# Patient Record
Sex: Female | Born: 1985 | Race: White | Hispanic: No | Marital: Single | Smoking: Never smoker
Health system: Southern US, Community
[De-identification: ages and names within clinical notes are randomized; demographics above are authoritative.]

## PROBLEM LIST (undated history)

## (undated) DIAGNOSIS — Z9109 Other allergy status, other than to drugs and biological substances: Secondary | ICD-10-CM

## (undated) DIAGNOSIS — L709 Acne, unspecified: Secondary | ICD-10-CM

## (undated) DIAGNOSIS — R51 Headache: Secondary | ICD-10-CM

## (undated) DIAGNOSIS — R519 Headache, unspecified: Secondary | ICD-10-CM

## (undated) DIAGNOSIS — Z87440 Personal history of urinary (tract) infections: Secondary | ICD-10-CM

## (undated) HISTORY — DX: Headache, unspecified: R51.9

## (undated) HISTORY — DX: Headache: R51

## (undated) HISTORY — DX: Other allergy status, other than to drugs and biological substances: Z91.09

## (undated) HISTORY — DX: Acne, unspecified: L70.9

---
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
## 2007-01-01 ENCOUNTER — Ambulatory Visit: Payer: Self-pay | Admitting: Family Medicine

## 2007-03-25 ENCOUNTER — Ambulatory Visit: Payer: Self-pay | Admitting: Family Medicine

## 2007-07-11 ENCOUNTER — Ambulatory Visit: Payer: Self-pay | Admitting: Family Medicine

## 2007-08-13 IMAGING — NM NM VCUG
2 series · 7 of 7 positions shown · non-contrast
Comparison: None available.

CLINICAL DATA: Pyelonephritis. 
 NUCLEAR MEDICINE VCUG ? 02/04/2006:
TECHNIQUE: 1 mCi 3c-WWm Pertechnetate infused into the bladder via Foley catheter along with 287cc of normal saline.

[Series 1: dy vcug · 9.36mm/px · 6 of 53 frames shown (1 of 2)]
[frame 5/53]
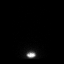
[frame 13/53]
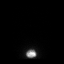
[frame 22/53]
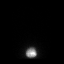
[frame 31/53]
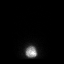
[frame 40/53]
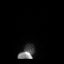
[frame 49/53]
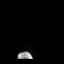

[Series 1: dy vcug · 1 of 1 slices shown (2 of 2)]
[im 1/1]
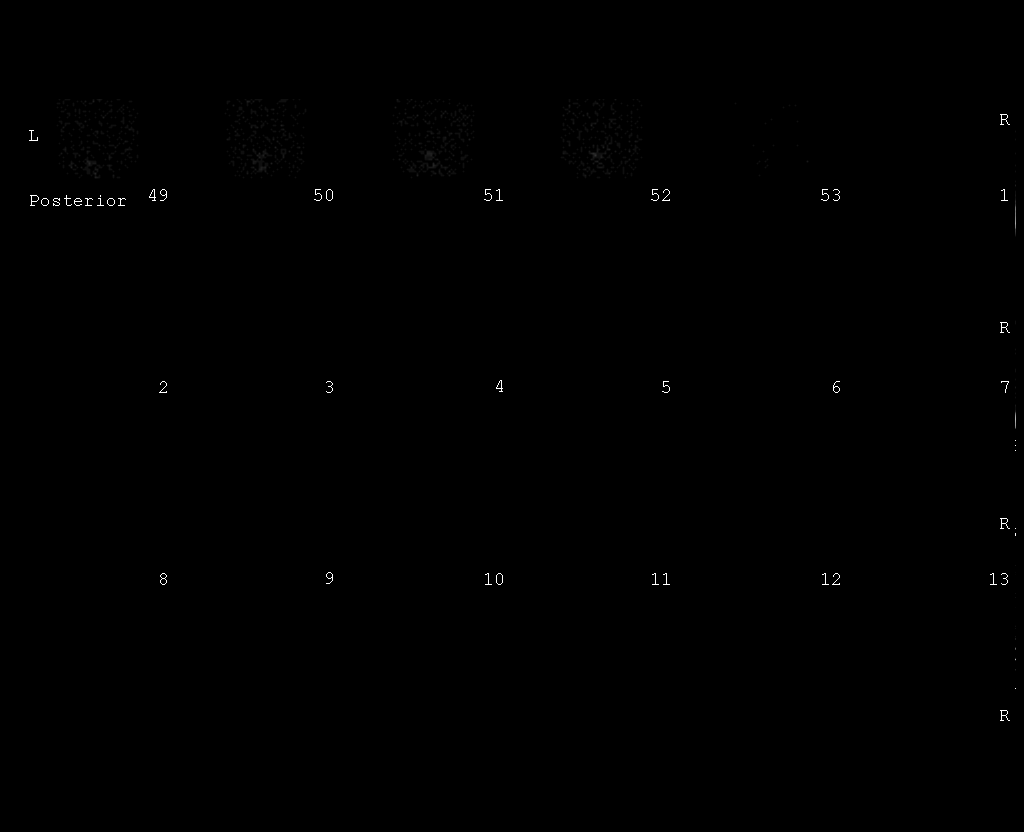

[7 of 7 positions shown; findings below may reference images not displayed]

FINDINGS: There is no evidence of vesicoureteral reflux.  Minimal post-void residual.
IMPRESSION: No evidence of vesicoureteral reflux.

## 2007-09-15 ENCOUNTER — Ambulatory Visit: Payer: Self-pay | Admitting: Family Medicine

## 2007-10-08 ENCOUNTER — Ambulatory Visit: Payer: Self-pay | Admitting: Family Medicine

## 2007-10-24 ENCOUNTER — Other Ambulatory Visit: Admission: RE | Admit: 2007-10-24 | Discharge: 2007-10-24 | Payer: Self-pay | Admitting: Gynecology

## 2008-04-07 ENCOUNTER — Ambulatory Visit: Payer: Self-pay | Admitting: Family Medicine

## 2008-05-27 ENCOUNTER — Other Ambulatory Visit: Admission: RE | Admit: 2008-05-27 | Discharge: 2008-05-27 | Payer: Self-pay | Admitting: Gynecology

## 2008-05-31 ENCOUNTER — Ambulatory Visit: Payer: Self-pay | Admitting: Family Medicine

## 2008-10-06 ENCOUNTER — Ambulatory Visit: Payer: Self-pay | Admitting: Gynecology

## 2008-10-29 ENCOUNTER — Other Ambulatory Visit: Admission: RE | Admit: 2008-10-29 | Discharge: 2008-10-29 | Payer: Self-pay | Admitting: Gynecology

## 2008-10-29 ENCOUNTER — Ambulatory Visit: Payer: Self-pay | Admitting: Women's Health

## 2009-04-06 ENCOUNTER — Ambulatory Visit: Payer: Self-pay | Admitting: Family Medicine

## 2009-05-30 ENCOUNTER — Ambulatory Visit: Payer: Self-pay | Admitting: Family Medicine

## 2009-06-16 ENCOUNTER — Ambulatory Visit: Payer: Self-pay | Admitting: Urology

## 2009-10-31 ENCOUNTER — Ambulatory Visit: Payer: Self-pay | Admitting: Women's Health

## 2009-10-31 ENCOUNTER — Other Ambulatory Visit: Admission: RE | Admit: 2009-10-31 | Discharge: 2009-10-31 | Payer: Self-pay | Admitting: Gynecology

## 2010-05-09 ENCOUNTER — Ambulatory Visit: Payer: Self-pay | Admitting: Family Medicine

## 2010-05-11 ENCOUNTER — Encounter: Admission: RE | Admit: 2010-05-11 | Discharge: 2010-05-11 | Payer: Self-pay | Admitting: Family Medicine

## 2010-11-01 ENCOUNTER — Ambulatory Visit: Payer: Self-pay | Admitting: Gynecology

## 2010-11-03 ENCOUNTER — Other Ambulatory Visit
Admission: RE | Admit: 2010-11-03 | Discharge: 2010-11-03 | Payer: Self-pay | Source: Home / Self Care | Admitting: Gynecology

## 2011-04-15 ENCOUNTER — Inpatient Hospital Stay (INDEPENDENT_AMBULATORY_CARE_PROVIDER_SITE_OTHER)
Admission: RE | Admit: 2011-04-15 | Discharge: 2011-04-15 | Disposition: A | Discharge status: Home / Self Care | Payer: BC Managed Care – PPO | Source: Ambulatory Visit | Attending: Emergency Medicine | Admitting: Emergency Medicine

## 2011-04-15 DIAGNOSIS — B86 Scabies: Secondary | ICD-10-CM

## 2011-06-08 ENCOUNTER — Encounter: Payer: Self-pay | Admitting: Family Medicine

## 2011-06-12 ENCOUNTER — Ambulatory Visit (INDEPENDENT_AMBULATORY_CARE_PROVIDER_SITE_OTHER): Payer: BC Managed Care – PPO | Admitting: Family Medicine

## 2011-06-12 ENCOUNTER — Encounter: Payer: Self-pay | Admitting: Family Medicine

## 2011-06-12 VITALS — BP 122/78 | HR 64 | Ht 66.0 in | Wt 139.0 lb

## 2011-06-12 DIAGNOSIS — J069 Acute upper respiratory infection, unspecified: Secondary | ICD-10-CM

## 2011-06-12 NOTE — Progress Notes (Signed)
  Subjective:    Patient ID: Margaret Wang, female    DOB: 11/10/1986, 25 y.o.   MRN: [id number]  HPI She is getting ready to start graduate school at [employer]. She has no particular concerns other than recent URI symptoms of nasal congestion, slight sore throat and swollen glands. No fever or chills. Review of her record indicates she did indeed have chickenpox at age 30. Review of Systems     Objective:   Physical Exam alert and in no distress. Tympanic membranes and canals are normal. Throat is clear. Tonsils are normal. Neck is supple without adenopathy or thyromegaly. Cardiac exam shows a regular sinus rhythm without murmurs or gallops. Lungs are clear to auscultation.        Assessment & Plan:  URI. Supportive care. Immunization was given to  her.

## 2011-10-29 DIAGNOSIS — L709 Acne, unspecified: Secondary | ICD-10-CM | POA: Insufficient documentation

## 2011-11-07 ENCOUNTER — Ambulatory Visit (INDEPENDENT_AMBULATORY_CARE_PROVIDER_SITE_OTHER): Payer: BC Managed Care – PPO | Admitting: Gynecology

## 2011-11-07 ENCOUNTER — Encounter: Payer: Self-pay | Admitting: Gynecology

## 2011-11-07 VITALS — BP 120/70 | Ht 65.5 in | Wt 142.0 lb

## 2011-11-07 DIAGNOSIS — Z309 Encounter for contraceptive management, unspecified: Secondary | ICD-10-CM

## 2011-11-07 DIAGNOSIS — Z13220 Encounter for screening for lipoid disorders: Secondary | ICD-10-CM

## 2011-11-07 DIAGNOSIS — Z01419 Encounter for gynecological examination (general) (routine) without abnormal findings: Secondary | ICD-10-CM

## 2011-11-07 DIAGNOSIS — Z113 Encounter for screening for infections with a predominantly sexual mode of transmission: Secondary | ICD-10-CM

## 2011-11-07 LAB — LIPID PANEL
HDL: 71 mg/dL (ref >39)
Total CHOL/HDL Ratio: 2.1 Ratio

## 2011-11-07 MED ORDER — ETONOGESTREL-ETHINYL ESTRADIOL 0.12-0.015 MG/24HR VA RING: VAGINAL_RING | VAGINAL | Status: DC

## 2011-11-07 NOTE — Progress Notes (Signed)
Margaret Wang 11/14/1985 [id number]        25 y.o.  for annual exam.  Wants to discuss birth control options.  Past medical history,surgical history, medications, allergies, family history and social history were all reviewed and documented in the EPIC chart. ROS:  Was performed and pertinent positives and negatives are included in the history.  Exam: chaperone present Filed Vitals:   11/07/11 0928  BP: 120/70   General appearance  Normal Skin grossly normal Head/Neck normal with no cervical or supraclavicular adenopathy thyroid normal Lungs  clear Cardiac RR, without RMG Abdominal  soft, nontender, without masses, organomegaly or hernia Breasts  examined lying and sitting without masses, retractions, discharge or axillary adenopathy. Pelvic  Ext/BUS/vagina  normal   Cervix  normal  GC Chlamydia screen done  Uterus  anteverted, normal size, shape and contour, midline and mobile nontender   Adnexa  Without masses or tenderness    Anus and perineum  normal    Assessment/Plan:  25 y.o. female for annual exam.    1. Birth control options. I reviewed all forms of contraception with her to include pill, patch, ring, Depo-Provera, Nexplanon, IUDs both Mirena and ParaGard. Patient had questions about Mirena IUD. I reviewed the issues of IUD to include possible infection leading to infertility. The current ACOG statement as far as being a legitimate choice for young women was also discussed. She is having some issues with acne. I did discuss possible increased acne risk with Mirena. After lengthy discussion she wants to try NuvaRing and I gave her a starter kit with a sample ring and annual prescription written. Every other month withdrawal options, offbrand labeling was reviewed. Patient will go ahead and try this follow up if there any issues. 2. STD screening. Patient asked for STD screening and a GC Chlamydia screen was done. No known exposure but wants to be screened. 3. Pap smear. She has  no history of significant abnormal Pap smears. She did have an ASCUS, negative high risk HPV in 2008 with annual follow ups all normal. I discussed less frequent screening interval. No Pap was done today and we'll plan on every 3 your screening with her last Pap smear being 2011. 4. Health maintenance. SBE monthly reviewed. Will check baseline CBC and lipid profile with urinalysis. Assuming she does well after starting the NuvaRing she'll see me in a year sooner as needed.    Dara Lords MD, 9:55 AM 11/07/2011

## 2011-11-07 NOTE — Patient Instructions (Signed)
Start NuvaRing as we discussed. If there are any issues call.

## 2011-11-08 LAB — GC/CHLAMYDIA PROBE AMP, GENITAL: GC Probe Amp, Genital: NEGATIVE

## 2011-11-17 IMAGING — US US SOFT TISSUE HEAD/NECK
1 series · 14 of 25 positions shown · non-contrast
Comparison: None.

CLINICAL DATA: Fatigue and weight gain.

THYROID ULTRASOUND
TECHNIQUE: Ultrasound examination of the thyroid gland and adjacent
soft tissues was performed.

[Series 1: us soft tissue head/neck · 0.07mm/px · 14 of 49 slices shown]
[im 1/49]
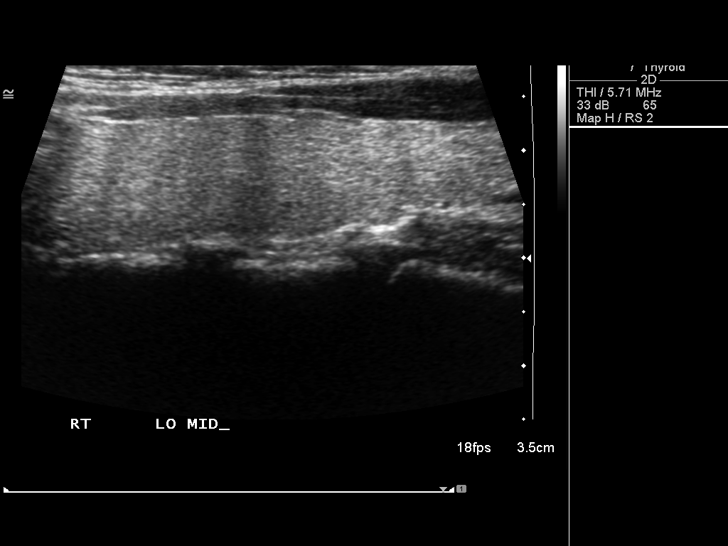
[im 5/49]
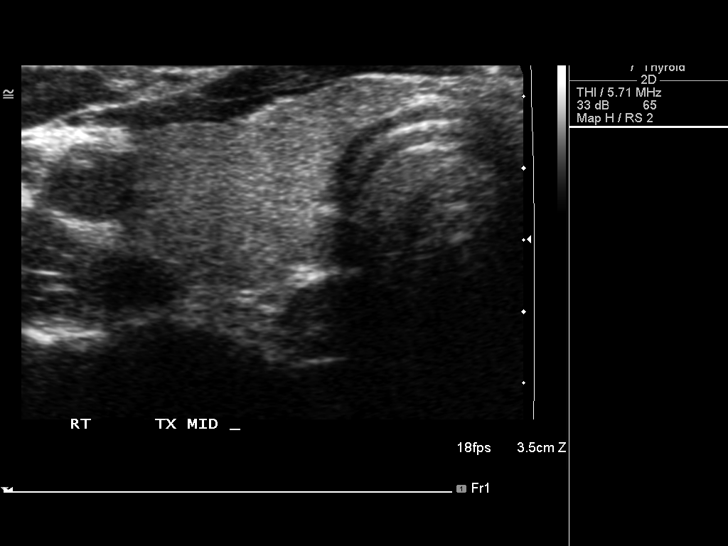
[im 9/49]
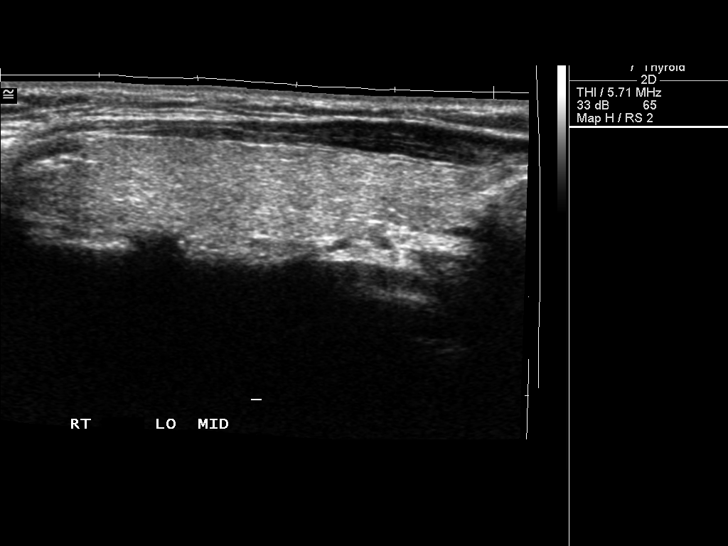
[im 13/49]
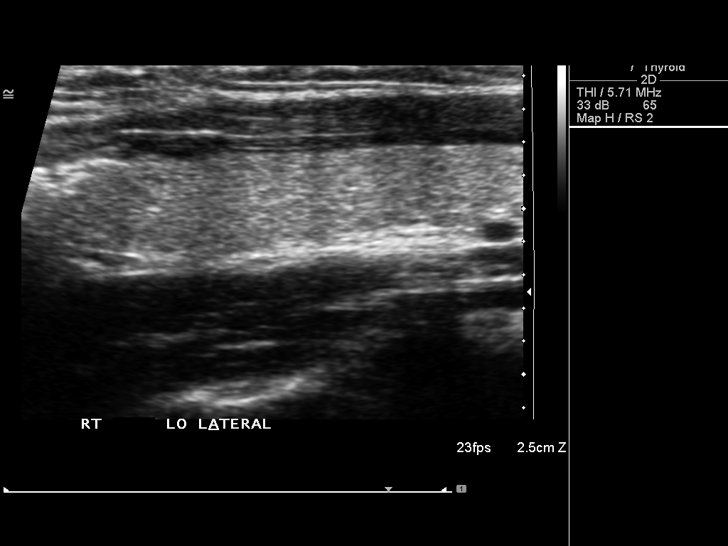
[im 17/49]
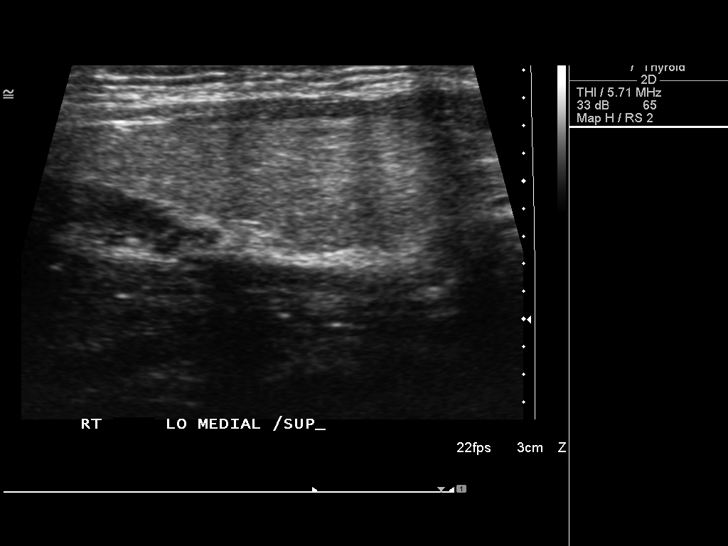
[im 19/49]
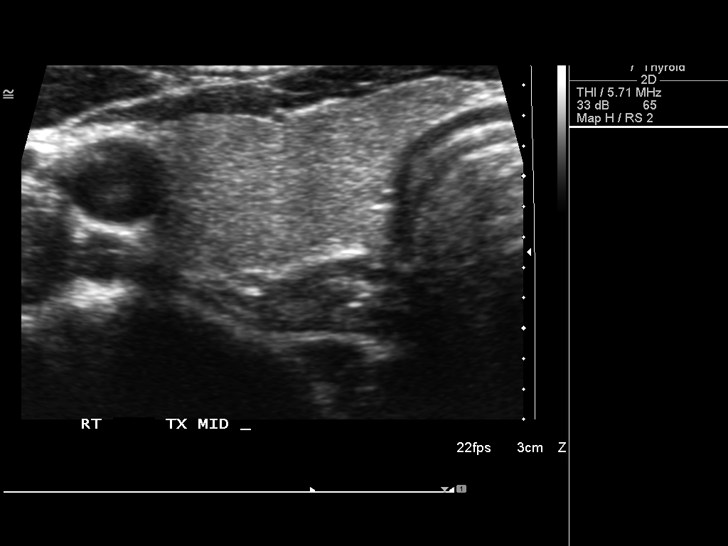
[im 23/49]
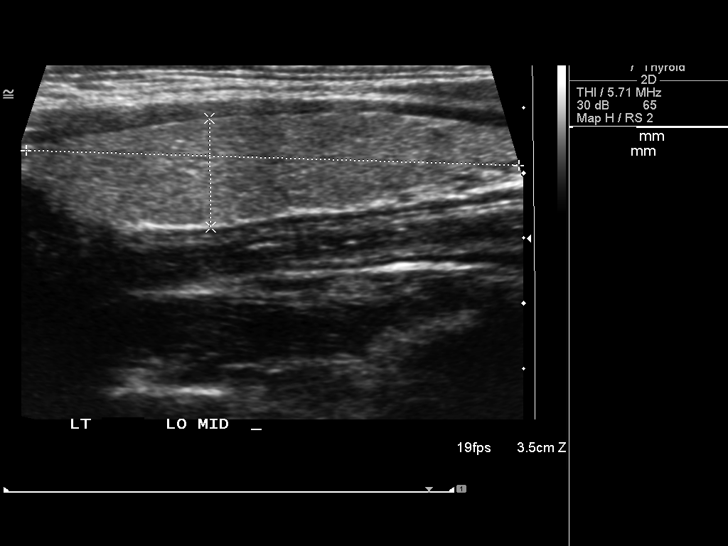
[im 27/49]
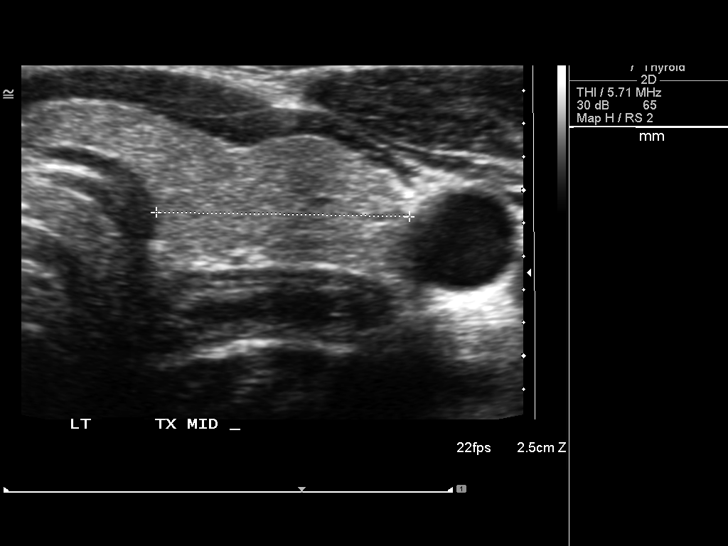
[im 31/49]
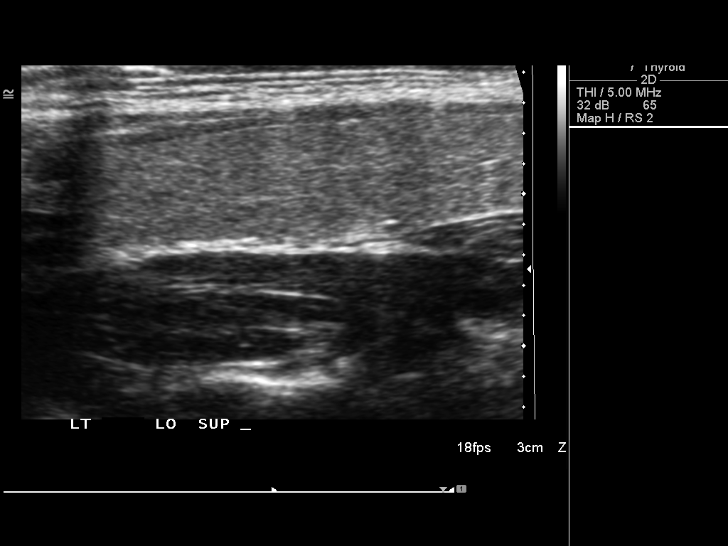
[im 33/49]
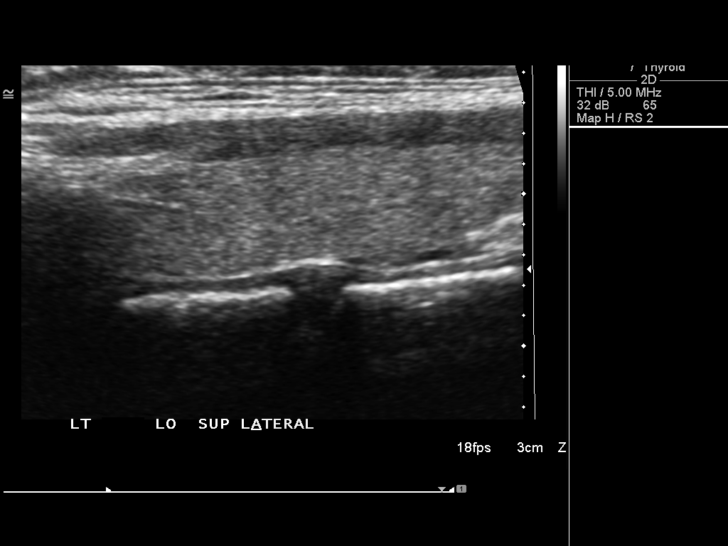
[im 37/49]
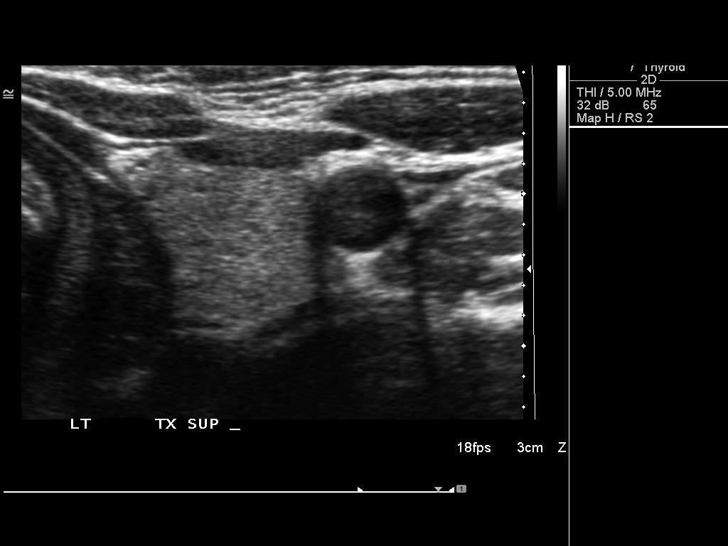
[im 41/49]
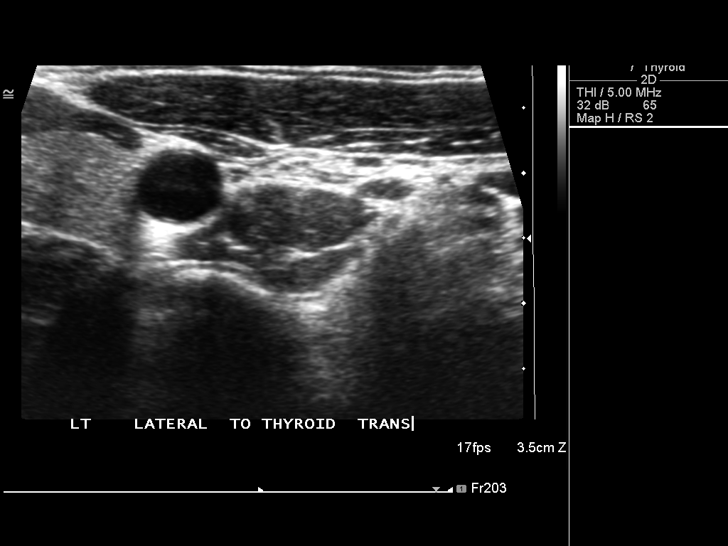
[im 45/49]
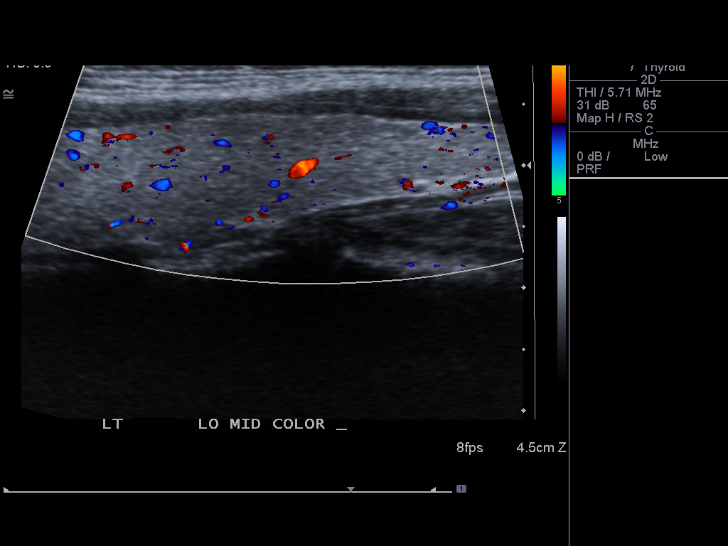
[im 49/49]
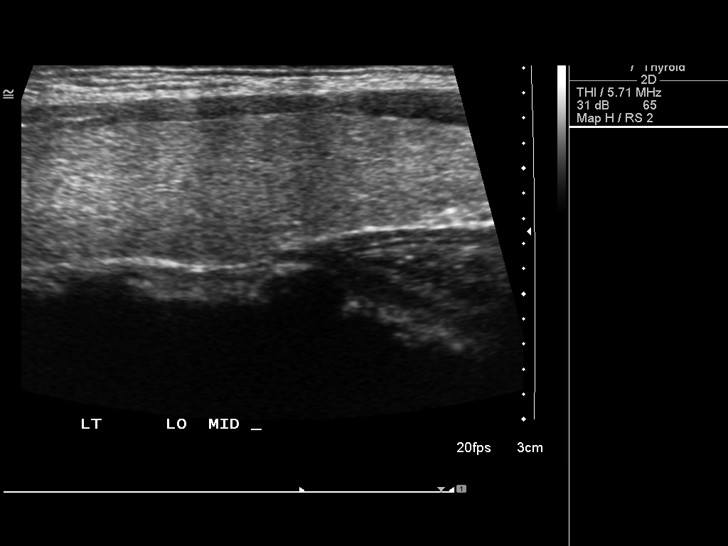

[14 of 25 positions shown; findings below may reference images not displayed]

FINDINGS: Right thyroid lobe:  4.8 x 1.0 x 1.4 cm
Left thyroid lobe:  4.1 x 0.8 x 1.5 cm
Isthmus:  0.3 cm

Focal nodules:  None

Lymphadenopathy:  Absent
IMPRESSION: Normal thyroid ultrasound.

## 2012-01-18 ENCOUNTER — Telehealth: Payer: Self-pay | Admitting: *Deleted

## 2012-01-18 MED ORDER — DROSPIRENONE-ETHINYL ESTRADIOL 3-0.02 MG PO TABS: 1.0000 | ORAL_TABLET | Freq: Every day | ORAL | Status: DC

## 2012-01-18 NOTE — Telephone Encounter (Signed)
Okay. Remind her as we have already discussed that there is a slight increased risk of thrombosis such as stroke heart attack DVT with yaz.

## 2012-01-18 NOTE — Telephone Encounter (Signed)
Left message on pt vm ,told pt to call if questions.

## 2012-01-18 NOTE — Telephone Encounter (Signed)
Pt has been using nuvaring since December 2012. She would like to switch back to her old birth control pills generic yaz. Pt said that she no longer feels comfortable with using nuvaring. Please advise

## 2012-06-03 ENCOUNTER — Telehealth: Payer: Self-pay | Admitting: *Deleted

## 2012-06-03 NOTE — Telephone Encounter (Signed)
Pt informed with the below note, she would like to have the ParaGard IUD, pt thinks this would be best for her body. Benefits will be checked and pt will be notified.

## 2012-06-03 NOTE — Telephone Encounter (Signed)
Okay to check benefits. ParaGard IUD good for 10 years but periods tend to be the same or heavier. Mirena is good for 5 years but periods tend to be very light or absent. My preference is Mirena but it would be patient's choice either way, just as long as she is aware of the difference.

## 2012-06-03 NOTE — Telephone Encounter (Signed)
Pt would like to switch to ParaGard IUD, she was taking generic yaz , but has decided that she would like IUD now. Pt said her LMP:05/21/12,  She stopped taking her birth control pill about 2 weeks ago, pt is using condoms for back up. OV or okay to check benefits? Please advise

## 2012-06-04 ENCOUNTER — Other Ambulatory Visit: Payer: Self-pay | Admitting: Gynecology

## 2012-06-04 ENCOUNTER — Telehealth: Payer: Self-pay | Admitting: Gynecology

## 2012-06-04 DIAGNOSIS — Z3049 Encounter for surveillance of other contraceptives: Secondary | ICD-10-CM

## 2012-06-04 NOTE — Telephone Encounter (Signed)
Patient informed I spoke with [place] at [facility] and was informed that Paraguard covered with a $10 copayment then ins. Pays 100%. Patient wants to proceed. Appt scheduled and order put in system.

## 2012-06-12 HISTORY — PX: INTRAUTERINE DEVICE INSERTION: SHX323

## 2012-06-20 ENCOUNTER — Ambulatory Visit (INDEPENDENT_AMBULATORY_CARE_PROVIDER_SITE_OTHER): Payer: BC Managed Care – PPO | Admitting: Gynecology

## 2012-06-20 ENCOUNTER — Encounter: Payer: Self-pay | Admitting: Gynecology

## 2012-06-20 DIAGNOSIS — Z30430 Encounter for insertion of intrauterine contraceptive device: Secondary | ICD-10-CM

## 2012-06-20 DIAGNOSIS — N882 Stricture and stenosis of cervix uteri: Secondary | ICD-10-CM

## 2012-06-20 NOTE — Progress Notes (Signed)
Patient presents for ParaGard IUD placement. She has been previously counseled on contraceptive options and has decided for IUD. She chose ParaGard over Mirena because of her side effects with oral contraceptives and she wanted something that was nonhormonal. I reviewed with her that she may have heavier periods or more crampy periods with the ParaGard and she understands and accepts this. She currently is on a normal menses and does not have a copper allergy. She understands that the ParaGard is good for 10 years.  She has read through the booklet, has no contraindications.  I reviewed the insertional process with her as well as the risks to include infection either immediate or long-term, uterine perforation or migration requiring surgery to remove and possibilities of failure with subsequent pregnancy.  I also again reviewed with her the issues of IUD and nulliparous patient and the potential for infection leading to infertility.  Exam with Sherrilyn Rist assistant Pelvic: External BUS vagina normal. Cervix normal with normal menses flow. Uterus retroverted to axial normal size shape contour midline mobile nontender. Adnexa without masses or tenderness.  Procedure: The cervix was cleansed with Betadine and due to her nulliparous status a paracervical block using 1% lidocaine 8 cc total was placed, anterior lip grasped with a single-tooth tenaculum, the uterus was sounded and a ParaGard IUD was placed according to manufacturer's recommendations without difficulty. The strings were trimmed. The patient tolerated well and will follow up in one month for a postinsertional check.  Lot number:[id number]

## 2012-06-20 NOTE — Patient Instructions (Signed)
Intrauterine Device Insertion Most often, an intrauterine device (IUD) is inserted into the uterus to prevent pregnancy. There are 2 types of IUDs available:  Copper IUD. This type of IUD creates an environment that is not favorable to sperm survival. The mechanism of action of the copper IUD is not known for certain. It can stay in place for 10 years.   Hormone IUD. This type of IUD contains the hormone progestin (synthetic progesterone). The progestin thickens the cervical mucus and prevents sperm from entering the uterus, and it also thins the uterine lining. There is no evidence that the hormone IUD prevents implantation. The hormone IUD can stay in place for up to 5 years.  An IUD is the most cost-effective birth control if left in place for the full duration. It may be removed at any time. LET YOUR CAREGIVER KNOW ABOUT:  Sensitivity to metals.   Medicines taken including herbs, eyedrops, over-the-counter medicines, and creams.   Use of steroids (by mouth or creams).   Previous problems with anesthetics or numbing medicine.   Previous gynecological surgery.   History of blood clots or clotting disorders.   Possibility of pregnancy.   Menstrual irregularities.   Concerns regarding unusual vaginal discharge or odors.   Previous experience with an IUD.   Other health problems.  RISKS AND COMPLICATIONS  Accidental puncture (perforation) of the uterus.   Accidental placement of the IUD either in the muscle layer of the uterus (myometrium) or outside the uterus. If this happen, the IUD can be found essentially floating around the bowels. When this happens, the IUD must be taken out surgically.   The IUD may fall out of the uterus (expulsion). This is more common in women who have recently had a child.    Pregnancy in the fallopian tube (ectopic).  BEFORE THE PROCEDURE  Schedule the IUD insertion for when you will have your menstrual period or right after, to make sure you  are not pregnant. Placement of the IUD is better tolerated shortly after a menstrual cycle.   You may need to take tests or be examined to make sure you are not pregnant.   You may be required to take a pregnancy test.   You may be required to get checked for sexually transmitted infections (STIs) prior to placement. Placing an IUD in someone who has an infection can make an infection worse.   You may be given a pain reliever to take 1 or 2 hours before the procedure.   An exam will be performed to determine the size and position of your uterus.   Ask your caregiver about changing or stopping your regular medicines.  PROCEDURE   A tool (speculum) is placed in the vagina. This allows your caregiver to see the lower part of the uterus (cervix).   The cervix is prepped with a medicine that lowers the risk of infection.   You may be given a medicine to numb each side of the cervix (intracervical or paracervical block). This is used to block and control any discomfort with insertion.   A tool (uterine sound) is inserted into the uterus to determine the length of the uterine cavity and the direction the uterus may be tilted.   A slim instrument (IUD inserter) is inserted through the cervical canal and into your uterus.   The IUD is placed in the uterine cavity and the insertion device is removed.   The nylon string that is attached to the IUD, and used for   eventual IUD removal, is trimmed. It is trimmed so that it lays high in the vagina, just outside the cervix.  AFTER THE PROCEDURE  You may have bleeding after the procedure. This is normal. It varies from light spotting for a few days to menstrual-like bleeding.   You may have mild cramping.   Practice checking the string coming out of the cervix to make sure the IUD remains in the uterus. If you cannot feel the string, you should schedule a "string check" with your caregiver.   If you had a hormone IUD inserted, expect that your  period may be lighter or nonexistent within a year's time (though this is not always the case). There may be delayed fertility with the hormone IUD as a result of its progesterone effect. When you are ready to become pregnant, it is suggested to have the IUD removed up to 1 year in advance.   Yearly exams are advised.  Document Released: 06/27/2011 Document Revised: 10/18/2011 Document Reviewed: 06/27/2011 Exit[employer] Patient Information 2012 [employer]. 

## 2012-07-15 ENCOUNTER — Ambulatory Visit (INDEPENDENT_AMBULATORY_CARE_PROVIDER_SITE_OTHER): Payer: BC Managed Care – PPO | Admitting: Gynecology

## 2012-07-15 ENCOUNTER — Encounter: Payer: Self-pay | Admitting: Gynecology

## 2012-07-15 DIAGNOSIS — Z30431 Encounter for routine checking of intrauterine contraceptive device: Secondary | ICD-10-CM

## 2012-07-15 NOTE — Patient Instructions (Signed)
Intrauterine Device Insertion Most often, an intrauterine device (IUD) is inserted into the uterus to prevent pregnancy. There are 2 types of IUDs available:  Copper IUD. This type of IUD creates an environment that is not favorable to sperm survival. The mechanism of action of the copper IUD is not known for certain. It can stay in place for 10 years.   Hormone IUD. This type of IUD contains the hormone progestin (synthetic progesterone). The progestin thickens the cervical mucus and prevents sperm from entering the uterus, and it also thins the uterine lining. There is no evidence that the hormone IUD prevents implantation. The hormone IUD can stay in place for up to 5 years.  An IUD is the most cost-effective birth control if left in place for the full duration. It may be removed at any time. LET YOUR CAREGIVER KNOW ABOUT:  Sensitivity to metals.   Medicines taken including herbs, eyedrops, over-the-counter medicines, and creams.   Use of steroids (by mouth or creams).   Previous problems with anesthetics or numbing medicine.   Previous gynecological surgery.   History of blood clots or clotting disorders.   Possibility of pregnancy.   Menstrual irregularities.   Concerns regarding unusual vaginal discharge or odors.   Previous experience with an IUD.   Other health problems.  RISKS AND COMPLICATIONS  Accidental puncture (perforation) of the uterus.   Accidental placement of the IUD either in the muscle layer of the uterus (myometrium) or outside the uterus. If this happen, the IUD can be found essentially floating around the bowels. When this happens, the IUD must be taken out surgically.   The IUD may fall out of the uterus (expulsion). This is more common in women who have recently had a child.    Pregnancy in the fallopian tube (ectopic).  BEFORE THE PROCEDURE  Schedule the IUD insertion for when you will have your menstrual period or right after, to make sure you  are not pregnant. Placement of the IUD is better tolerated shortly after a menstrual cycle.   You may need to take tests or be examined to make sure you are not pregnant.   You may be required to take a pregnancy test.   You may be required to get checked for sexually transmitted infections (STIs) prior to placement. Placing an IUD in someone who has an infection can make an infection worse.   You may be given a pain reliever to take 1 or 2 hours before the procedure.   An exam will be performed to determine the size and position of your uterus.   Ask your caregiver about changing or stopping your regular medicines.  PROCEDURE   A tool (speculum) is placed in the vagina. This allows your caregiver to see the lower part of the uterus (cervix).   The cervix is prepped with a medicine that lowers the risk of infection.   You may be given a medicine to numb each side of the cervix (intracervical or paracervical block). This is used to block and control any discomfort with insertion.   A tool (uterine sound) is inserted into the uterus to determine the length of the uterine cavity and the direction the uterus may be tilted.   A slim instrument (IUD inserter) is inserted through the cervical canal and into your uterus.   The IUD is placed in the uterine cavity and the insertion device is removed.   The nylon string that is attached to the IUD, and used for   eventual IUD removal, is trimmed. It is trimmed so that it lays high in the vagina, just outside the cervix.  AFTER THE PROCEDURE  You may have bleeding after the procedure. This is normal. It varies from light spotting for a few days to menstrual-like bleeding.   You may have mild cramping.   Practice checking the string coming out of the cervix to make sure the IUD remains in the uterus. If you cannot feel the string, you should schedule a "string check" with your caregiver.   If you had a hormone IUD inserted, expect that your  period may be lighter or nonexistent within a year's time (though this is not always the case). There may be delayed fertility with the hormone IUD as a result of its progesterone effect. When you are ready to become pregnant, it is suggested to have the IUD removed up to 1 year in advance.   Yearly exams are advised.  Document Released: 06/27/2011 Document Revised: 10/18/2011 Document Reviewed: 06/27/2011 Exit[employer] Patient Information 2012 [employer]. 

## 2012-07-15 NOTE — Progress Notes (Signed)
Patient presents in follow up from [facility] IUD placement. She's doing well without complaints.  Exam was Sherrilyn Rist Asst. Pelvic external BUS vagina normal. Cervix normal IUD string visualized and trimmed. Uterus normal size midline mobile nontender. Adnexa without masses or tenderness.  Assessment and plan: IUD follow up. Doing well. Due for annual end of December/beginning of January she knows to schedule this.

## 2012-08-21 ENCOUNTER — Emergency Department
Admission: EM | Admit: 2012-08-21 | Discharge: 2012-08-21 | Disposition: A | Discharge status: Home / Self Care | Payer: BC Managed Care – PPO | Source: Home / Self Care

## 2012-08-21 ENCOUNTER — Encounter: Payer: Self-pay | Admitting: *Deleted

## 2012-08-21 DIAGNOSIS — Z87440 Personal history of urinary (tract) infections: Secondary | ICD-10-CM

## 2012-08-21 DIAGNOSIS — J039 Acute tonsillitis, unspecified: Secondary | ICD-10-CM

## 2012-08-21 DIAGNOSIS — R300 Dysuria: Secondary | ICD-10-CM

## 2012-08-21 DIAGNOSIS — J329 Chronic sinusitis, unspecified: Secondary | ICD-10-CM

## 2012-08-21 DIAGNOSIS — J029 Acute pharyngitis, unspecified: Secondary | ICD-10-CM

## 2012-08-21 HISTORY — DX: Personal history of urinary (tract) infections: Z87.440

## 2012-08-21 MED ORDER — CEFTRIAXONE SODIUM 1 G IJ SOLR
1.0000 g | Freq: Once | INTRAMUSCULAR | Status: AC
  Administered 2012-08-21: 1 g via INTRAMUSCULAR

## 2012-08-21 MED ORDER — CEFIXIME 200 MG/5ML PO SUSR: 400.0000 mg | Freq: Every day | ORAL | Status: AC

## 2012-08-21 MED ORDER — FLUTICASONE PROPIONATE 50 MCG/ACT NA SUSP: 2.0000 | Freq: Every day | NASAL | Status: DC

## 2012-08-21 NOTE — ED Provider Notes (Signed)
History     CSN: [id number]  Arrival date & time 08/21/12  1132   None     Chief Complaint  Patient presents with  . Sore Throat   HPI URI Symptoms Onset: 2 weeks Description: sinus pressure, drainage, sore throat, cough Modifying factors:  Pt states that she has a history of recurrent tonsillitis and sinusitis in the past. Last sinusitis was last year. Has had progressive sinus pressure and sore throat over the last 2 weeks.  Pt also recently seen for [facility] Pt was place on bactrim. Pt also reports hx/o recurrent UTIs in the past as well as in childhood. Has been seen by [facility] in the past. Last UTI was > 1 year ago. Pt states that she hasnt been able to swallow pills 2/2 throat pain. Dysuria has minimally improved. Urine culture was not obtained at [facility].   Symptoms Nasal discharge: mild  Fever: yes Sore throat: yes Cough: yes Wheezing: no Ear pain: no GI symptoms: no Sick contacts: unknown   Red Flags  Stiff neck: no Dyspnea: no Rash: no Swallowing difficulty: no  Sinusitis Risk Factors Headache/face pain: yes Double sickening: possible tooth pain: no  Allergy Risk Factors Sneezing: yes Itchy scratchy throat: no Seasonal symptoms: yes; sees allergist   Flu Risk Factors Headache: yes muscle aches: no severe fatigue: mild    Past Medical History  Diagnosis Date  . Acne   . Generalized headaches   . Vesicoureteral reflux   . History of recurrent UTIs     Past Surgical History  Procedure Date  . Intrauterine device insertion 06/2012    PARAGUARD    Family History  Problem Relation Age of Onset  . Asthma Brother   . Breast cancer Paternal Grandmother   . Hypertension Father   . Ovarian cancer Maternal Grandmother     History  Substance Use Topics  . Smoking status: Never Smoker   . Smokeless tobacco: Never Used  . Alcohol Use: Yes     ONE OR TWO DRINKS A WEEK    OB History    Grav Para Term  Preterm Abortions TAB SAB Ect Mult Living   0               Review of Systems  All other systems reviewed and are negative.    Allergies  Review of patient's allergies indicates no known allergies.  Home Medications   Current Outpatient Rx  Name Route Sig Dispense Refill  . SULFAMETHOXAZOLE-TMP DS 800-160 MG PO TABS Oral Take 1 tablet by mouth 2 (two) times daily.    . CEFIXIME 200 MG/5ML PO SUSR Oral Take 10 mLs (400 mg total) by mouth daily. 100 mL 0  . MULTIVITAMINS PO CAPS Oral Take 1 capsule by mouth daily.        BP 123/81  Pulse 113  Temp 98.4 F (36.9 C) (Oral)  Ht 5' 5.5" (1.664 m)  Wt 139 lb (63.05 kg)  BMI 22.78 kg/m2  SpO2 100%  LMP 08/07/2012  Physical Exam  Constitutional: She is oriented to person, place, and time. She appears well-developed and well-nourished.  HENT:  Head: Normocephalic and atraumatic.  Right Ear: External ear normal.  Left Ear: External ear normal.  Mouth/Throat: Oropharyngeal exudate present.       +nasal erythema, rhinorrhea bilaterally, + post oropharyngeal erythema    Eyes: Conjunctivae normal are normal. Pupils are equal, round, and reactive to light.  Neck: Normal range  of motion. Neck supple.  Cardiovascular: Normal rate and regular rhythm.   Pulmonary/Chest: Effort normal and breath sounds normal.  Abdominal: Soft. Bowel sounds are normal.       Mild suprapubic tenderness Faint flank pain    Musculoskeletal: Normal range of motion.  Lymphadenopathy:    She has cervical adenopathy.  Neurological: She is alert and oriented to person, place, and time.  Skin: Skin is warm.    ED Course  Procedures (including critical care time)   Labs Reviewed  POCT RAPID STREP A (OFFICE)   No results found.   1. Sinusitis   2. Tonsillitis   3. Pharyngitis   4. Dysuria   5. History of recurrent UTI (urinary tract infection)       MDM  Will treat with with broad spectrum abx for multi system coverage.  Rocephin 1gm IM  x1.  Suprax 400mg  daily x 7 days. Rxd in liquid form to help with swallowing.  Antibiotic selection discussed with pharmacist.  Given hx/o recurrent upper resp and GU infections, plan for follow up with urology. Discussed ENT set up.  Will also start on flonase and antihistamine to help with allergic sxs.  Discussed upper resp/pulmonary and GU red flags at length including trismus, SOB, fevers, worsening flank pain, generalized malaise.  Otherwise follow up as needed.      The patient and/or caregiver has been counseled thoroughly with regard to treatment plan and/or medications prescribed including dosage, schedule, interactions, rationale for use, and possible side effects and they verbalize understanding. Diagnoses and expected course of recovery discussed and will return if not improved as expected or if the condition worsens. Patient and/or caregiver verbalized understanding.             Doree Albee, MD 08/21/12 1310

## 2012-08-21 NOTE — ED Notes (Signed)
Patient c/o sore throat x last night. Tonsils are enlarged with white spots. She is currently prescribed bactrim for UTI diagnosed yesterday. She has only had 2 doses, too painful to swallow pills.

## 2012-08-23 ENCOUNTER — Telehealth: Payer: Self-pay

## 2012-08-23 NOTE — ED Notes (Signed)
Left a message on voice mail asking how patient is feeling and advising to call back with any questions or concerns.  

## 2012-12-08 ENCOUNTER — Encounter: Payer: BC Managed Care – PPO | Admitting: Gynecology

## 2012-12-19 ENCOUNTER — Encounter: Payer: BC Managed Care – PPO | Admitting: Gynecology

## 2012-12-26 ENCOUNTER — Encounter: Payer: BC Managed Care – PPO | Admitting: Gynecology

## 2013-01-26 ENCOUNTER — Other Ambulatory Visit (HOSPITAL_COMMUNITY)
Admission: RE | Admit: 2013-01-26 | Discharge: 2013-01-26 | Disposition: A | Discharge status: Home / Self Care | Payer: BC Managed Care – PPO | Source: Ambulatory Visit | Attending: Gynecology | Admitting: Gynecology

## 2013-01-26 ENCOUNTER — Encounter: Payer: Self-pay | Admitting: Gynecology

## 2013-01-26 ENCOUNTER — Ambulatory Visit: Payer: BC Managed Care – PPO | Admitting: Gynecology

## 2013-01-26 VITALS — BP 116/74 | Ht 66.0 in | Wt 138.0 lb

## 2013-01-26 DIAGNOSIS — R209 Unspecified disturbances of skin sensation: Secondary | ICD-10-CM

## 2013-01-26 DIAGNOSIS — N644 Mastodynia: Secondary | ICD-10-CM

## 2013-01-26 DIAGNOSIS — Z9109 Other allergy status, other than to drugs and biological substances: Secondary | ICD-10-CM | POA: Insufficient documentation

## 2013-01-26 DIAGNOSIS — Z113 Encounter for screening for infections with a predominantly sexual mode of transmission: Secondary | ICD-10-CM

## 2013-01-26 DIAGNOSIS — Z01419 Encounter for gynecological examination (general) (routine) without abnormal findings: Secondary | ICD-10-CM

## 2013-01-26 DIAGNOSIS — Z30431 Encounter for routine checking of intrauterine contraceptive device: Secondary | ICD-10-CM

## 2013-01-26 LAB — CBC WITH DIFFERENTIAL/PLATELET
Basophils Absolute: 0 10*3/uL (ref 0.0–0.1)
Eosinophils Absolute: 0.2 10*3/uL (ref 0.0–0.7)
HCT: 41.5 % (ref 36.0–46.0)
MCH: 29.9 pg (ref 26.0–34.0)
MCV: 88.7 fL (ref 78.0–100.0)
Monocytes Relative: 8 % (ref 3–12)
Neutro Abs: 4.4 10*3/uL (ref 1.7–7.7)
Neutrophils Relative %: 53 % (ref 43–77)
RDW: 13.1 % (ref 11.5–15.5)

## 2013-01-26 LAB — SEDIMENTATION RATE: Sed Rate: 1 mm/hr (ref 0–22)

## 2013-01-26 LAB — COMPREHENSIVE METABOLIC PANEL
ALT: 14 U/L (ref 0–35)
AST: 24 U/L (ref 0–37)
Albumin: 4.9 g/dL (ref 3.5–5.2)
BUN: 13 mg/dL (ref 6–23)
Calcium: 9.4 mg/dL (ref 8.4–10.5)
Creat: 0.77 mg/dL (ref 0.50–1.10)
Glucose, Bld: 79 mg/dL (ref 70–99)
Potassium: 3.8 mEq/L (ref 3.5–5.3)
Total Protein: 6.9 g/dL (ref 6.0–8.3)

## 2013-01-26 LAB — TSH: TSH: 2.03 u[IU]/mL (ref 0.350–4.500)

## 2013-01-26 NOTE — Patient Instructions (Signed)
Log on to my chart to check your lab results. Followup in one year for annual exam. Followup sooner if breakthrough bleeding continues or any other issues.

## 2013-01-26 NOTE — Progress Notes (Signed)
Margaret Wang Aug 26, 1986 [id number]        27 y.o.  G0P0 for annual exam.  Several issues noted below.  Past medical history,surgical history, medications, allergies, family history and social history were all reviewed and documented in the EPIC chart. ROS:  Was performed and pertinent positives and negatives are included in the history.  Exam: Kim assistant Filed Vitals:   01/26/13 0956  BP: 116/74  Height: 5\' 6"  (1.676 m)  Weight: 138 lb (62.596 kg)   General appearance  Normal Skin grossly normal Head/Neck normal with no cervical or supraclavicular adenopathy thyroid normal Lungs  clear Cardiac RR, without RMG Abdominal  soft, nontender, without masses, organomegaly or hernia Breasts  examined lying and sitting without masses, retractions, discharge or axillary adenopathy. Pelvic  Ext/BUS/vagina  normal   Cervix  normal Pap/GC/Chlamydia screen done  Uterus  retroverted, normal size, shape and contour, midline and mobile nontender   Adnexa  Without masses or tenderness    Anus and perineum  normal       Assessment/Plan:  27 y.o. G0P0 female for annual exam.   1. Paraguard IUD. Doing well with regular monthly menses. Had 2 months of spotting midcycle, other months okay. Will keep a menstrual calendar present. Break through bleeding continues she needs to follow up. 2. Mastalgia left breast. Comes and goes. Exam is normal. The patient will continue with self breast exams and as long as no palpable abnormalities in this tenderness comes and goes will monitor. Any persistent pain or palpable abnormality she knows to follow up with me. 3. Pap smear done today. Assuming negative plan three-year repeat. 2 prior Pap smears normal with no history of abnormal Pap smears. 4. STD screening. GC chlamydia screen done at her request. No known exposure but wants screened. 5. Complaints of cold extremities both hands and feet despite temperature change. Sometimes will turn bluish in color. No  blanching or pain. No other symptoms such as skin hair weight night sweats. Discussed differential to include possible Raynaud's phenomena.  Recommend baseline ANA rheumatoid factor sedimentation rate TSH copper his metabolic panel. Assuming negative we'll monitor present. If continues or certainly worsens will consider rheumatology referral. 6. Health maintenance. CBC urinalysis ordered along with above blood work. Had normal lipid profile 2012 will not repeat now. Followup for lab work otherwise in one year,   Dara Lords MD, 10:39 AM 01/26/2013

## 2013-01-26 NOTE — Addendum Note (Signed)
Addended by: Dayna Barker on: 01/26/2013 10:46 AM   Modules accepted: Orders

## 2013-01-27 LAB — URINALYSIS W MICROSCOPIC + REFLEX CULTURE
Crystals: NONE SEEN
Nitrite: NEGATIVE
Specific Gravity, Urine: 1.015 (ref 1.005–1.030)

## 2013-01-27 LAB — GC/CHLAMYDIA PROBE AMP
CT Probe RNA: NEGATIVE
GC Probe RNA: NEGATIVE

## 2013-04-10 ENCOUNTER — Encounter: Payer: Self-pay | Admitting: Gynecology

## 2013-04-10 ENCOUNTER — Ambulatory Visit (INDEPENDENT_AMBULATORY_CARE_PROVIDER_SITE_OTHER): Payer: BC Managed Care – PPO | Admitting: Gynecology

## 2013-04-10 DIAGNOSIS — R300 Dysuria: Secondary | ICD-10-CM

## 2013-04-10 DIAGNOSIS — A499 Bacterial infection, unspecified: Secondary | ICD-10-CM

## 2013-04-10 DIAGNOSIS — N760 Acute vaginitis: Secondary | ICD-10-CM

## 2013-04-10 DIAGNOSIS — L293 Anogenital pruritus, unspecified: Secondary | ICD-10-CM

## 2013-04-10 DIAGNOSIS — N898 Other specified noninflammatory disorders of vagina: Secondary | ICD-10-CM

## 2013-04-10 LAB — URINALYSIS W MICROSCOPIC + REFLEX CULTURE
Bilirubin Urine: NEGATIVE
Glucose, UA: NEGATIVE mg/dL
Hgb urine dipstick: NEGATIVE
Ketones, ur: NEGATIVE mg/dL
Leukocytes, UA: NEGATIVE
Nitrite: NEGATIVE
Protein, ur: NEGATIVE mg/dL
Specific Gravity, Urine: <1.005 — ABNORMAL LOW (ref 1.005–1.030)
Urobilinogen, UA: 0.2 mg/dL (ref 0.0–1.0)
pH: 7 (ref 5.0–8.0)

## 2013-04-10 LAB — WET PREP FOR TRICH, YEAST, CLUE
Clue Cells Wet Prep HPF POC: NONE SEEN
Trich, Wet Prep: NONE SEEN
Yeast Wet Prep HPF POC: NONE SEEN

## 2013-04-10 MED ORDER — CLINDAMYCIN PHOSPHATE 2 % VA CREA: 1.0000 | TOPICAL_CREAM | Freq: Every day | VAGINAL | Status: DC

## 2013-04-10 NOTE — Patient Instructions (Signed)
Use vaginal cream nightly for one week. Followup if symptoms persist, worsen or recur.

## 2013-04-10 NOTE — Progress Notes (Signed)
Patient presents with a one-week history of vaginal itching and some end stream dysuria. No abdominal pain fever chills or significant low back pain.  Exam with Selena Batten assistant Spine straight without CVA tenderness. Abdomen soft nontender without masses guarding rebound organomegaly. Pelvic external BUS vagina with white discharge. Cervix normal IUD string visualized. Uterus normal size midline mobile nontender. Adnexa without masses or tenderness.  Assessment and plan: Urinalysis negative, wet prep consistent with bacterial vaginosis. Treat with Cleocin vaginal cream nightly x7 days at her choice. Followup if symptoms persist, worsen or recur.

## 2013-07-30 ENCOUNTER — Telehealth: Payer: Self-pay | Admitting: Internal Medicine

## 2013-07-30 NOTE — Telephone Encounter (Signed)
Faxed over medical records to [facility]  @ [phone]

## 2014-04-20 ENCOUNTER — Ambulatory Visit (INDEPENDENT_AMBULATORY_CARE_PROVIDER_SITE_OTHER): Payer: BC Managed Care – PPO | Admitting: Women's Health

## 2014-04-20 ENCOUNTER — Encounter: Payer: Self-pay | Admitting: Women's Health

## 2014-04-20 VITALS — BP 120/70 | Ht 66.5 in | Wt 140.0 lb

## 2014-04-20 DIAGNOSIS — Z01419 Encounter for gynecological examination (general) (routine) without abnormal findings: Secondary | ICD-10-CM

## 2014-04-20 DIAGNOSIS — Z113 Encounter for screening for infections with a predominantly sexual mode of transmission: Secondary | ICD-10-CM

## 2014-04-20 DIAGNOSIS — N920 Excessive and frequent menstruation with regular cycle: Secondary | ICD-10-CM

## 2014-04-20 DIAGNOSIS — N898 Other specified noninflammatory disorders of vagina: Secondary | ICD-10-CM

## 2014-04-20 LAB — CBC WITH DIFFERENTIAL/PLATELET
BASOS PCT: 1 % (ref 0–1)
Basophils Absolute: 0.1 10*3/uL (ref 0.0–0.1)
Eosinophils Absolute: 0.2 10*3/uL (ref 0.0–0.7)
Eosinophils Relative: 3 % (ref 0–5)
HEMATOCRIT: 41.9 % (ref 36.0–46.0)
Hemoglobin: 14.1 g/dL (ref 12.0–15.0)
Lymphocytes Relative: 31 % (ref 12–46)
Lymphs Abs: 2.3 10*3/uL (ref 0.7–4.0)
MCH: 30.1 pg (ref 26.0–34.0)
MCHC: 33.7 g/dL (ref 30.0–36.0)
MCV: 89.3 fL (ref 78.0–100.0)
MONO ABS: 0.5 10*3/uL (ref 0.1–1.0)
MONOS PCT: 7 % (ref 3–12)
NEUTROS ABS: 4.4 10*3/uL (ref 1.7–7.7)
NEUTROS PCT: 58 % (ref 43–77)
Platelets: 353 10*3/uL (ref 150–400)
RBC: 4.69 MIL/uL (ref 3.87–5.11)
RDW: 13 % (ref 11.5–15.5)
WBC: 7.5 10*3/uL (ref 4.0–10.5)

## 2014-04-20 LAB — RPR

## 2014-04-20 LAB — WET PREP FOR TRICH, YEAST, CLUE
Clue Cells Wet Prep HPF POC: NONE SEEN
Trich, Wet Prep: NONE SEEN
Yeast Wet Prep HPF POC: NONE SEEN

## 2014-04-20 LAB — HIV ANTIBODY (ROUTINE TESTING W REFLEX): HIV: NONREACTIVE

## 2014-04-20 LAB — TSH: TSH: 0.891 u[IU]/mL (ref 0.350–4.500)

## 2014-04-20 LAB — PROLACTIN: Prolactin: 8 ng/mL

## 2014-04-20 LAB — HEPATITIS B SURFACE ANTIGEN: HEP B S AG: NEGATIVE

## 2014-04-20 LAB — HEPATITIS C ANTIBODY: HCV Ab: NEGATIVE

## 2014-04-20 NOTE — Patient Instructions (Signed)

## 2014-04-20 NOTE — Progress Notes (Signed)
Margaret Wang 11-25-1985 [id number]    History:    Presents for annual exam. Light monthly 7 day spotting cycle/paragard (06/2012). Cycles lighter than prior to ParaGard. No spotting between cycles. New partner. Treated for molluscum 09/2013.  Normal pap 2014. Gardasil series completed.   Past medical history, past surgical history, family history and social history were all reviewed and documented in the EPIC chart. 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   ROS:  A  12 point ROS was performed and pertinent positives and negatives are included.  Exam:  Filed Vitals:   04/20/14 0844  BP: 120/70    General appearance:  Normal Thyroid:  Symmetrical, normal in size, without palpable masses or nodularity. Respiratory  Auscultation:  Clear without wheezing or rhonchi Cardiovascular  Auscultation:  Regular rate, without rubs, murmurs or gallops  Edema/varicosities:  Not grossly evident Abdominal  Soft,nontender, without masses, guarding or rebound.  Liver/spleen:  No organomegaly noted  Hernia:  None appreciated  Skin  Inspection:  Grossly normal   Breasts: Examined lying and sitting.     Right: Without masses, retractions, discharge or axillary adenopathy.     Left: Without masses, retractions, discharge or axillary adenopathy. Gentitourinary   Inguinal/mons:  Normal without inguinal adenopathy  External genitalia:  Normal, no molluscum noted  BUS/Urethra/Skene's glands:  Normal  Vagina:  Normal wet prep negative  Cervix:  Normal. IUD string visualized.  Uterus:  anteverted, normal in size, shape and contour.  Midline and mobile  Adnexa/parametria:     Rt: Without masses or tenderness.   Lt: Without masses or tenderness.  Anus and perineum: Normal   Assessment/Plan:  28 y.o.  SWF G0P0 for annual exam.  Spotting 7 day monthly cycle STD screen ParaGard IUD placed 06/2012  - Dr. Audie Box  1. Spotting: Hep B, HepC, HIV, RPR, GC/Chlamydia, TSH, prolactin, CBC, UA. Wet prep negative. 2.  Health Maintenance: SBE's, Healthy diet and exercise, MVI, driving safety, condom use reviewed. Pap normal 2014, new screening guidelines reviewed. 3. Keep menstrual calendar, if cycles/spotting last longer than 7 days instructed to followup.   Note: This dictation was prepared with Dragon/digital dictation.  Any transcriptional errors that result are unintentional. Harrington Challenger [facility], 9:12 AM 04/20/2014

## 2014-04-21 LAB — URINALYSIS W MICROSCOPIC + REFLEX CULTURE
Bilirubin Urine: NEGATIVE
CASTS: NONE SEEN
CRYSTALS: NONE SEEN
Glucose, UA: NEGATIVE mg/dL
Hgb urine dipstick: NEGATIVE
KETONES UR: NEGATIVE mg/dL
LEUKOCYTES UA: NEGATIVE
NITRITE: NEGATIVE
Protein, ur: NEGATIVE mg/dL
SPECIFIC GRAVITY, URINE: 1.021 (ref 1.005–1.030)
SQUAMOUS EPITHELIAL / LPF: NONE SEEN
UROBILINOGEN UA: 0.2 mg/dL (ref 0.0–1.0)
pH: 7 (ref 5.0–8.0)

## 2014-04-21 LAB — GC/CHLAMYDIA PROBE AMP
CT Probe RNA: NEGATIVE
GC Probe RNA: NEGATIVE

## 2014-11-02 ENCOUNTER — Encounter: Payer: Self-pay | Admitting: Gynecology

## 2014-11-02 ENCOUNTER — Ambulatory Visit (INDEPENDENT_AMBULATORY_CARE_PROVIDER_SITE_OTHER): Payer: 59 | Admitting: Gynecology

## 2014-11-02 DIAGNOSIS — R140 Abdominal distension (gaseous): Secondary | ICD-10-CM

## 2014-11-02 DIAGNOSIS — Z23 Encounter for immunization: Secondary | ICD-10-CM

## 2014-11-02 MED ORDER — IBUPROFEN 800 MG PO TABS: 800.0000 mg | ORAL_TABLET | Freq: Three times a day (TID) | ORAL | Status: AC | PRN

## 2014-11-02 NOTE — Patient Instructions (Signed)
Call if symptoms persist

## 2014-11-02 NOTE — Addendum Note (Signed)
Addended by: Dayna BarkerGARDNER, Cendy Oconnor K on: 11/02/2014 02:49 PM   Modules accepted: Orders

## 2014-11-02 NOTE — Progress Notes (Signed)
Margaret StanleyLisa Wang 05/25/1986 [id number]        28 y.o.  G0P0 Presents complaining of lower abdominal bloating and diarrhea at the time of her menses. Has been occurring consistently over the last several months. Happened sporadically before. Does not occur in between her menses. No nausea vomiting upper abdominal pain dysuria frequency urgency. Using ParaGard IUD. Had tried low-dose oral contraceptives but had a variety of side effects. Is happy with the IUD in that her menses are regular every 3-4 weeks and relatively light with no significant dysmenorrhea. Not having any pelvic pain otherwise and no dyspareunia.  Past medical history,surgical history, problem list, medications, allergies, family history and social history were all reviewed and documented in the EPIC chart.  Directed ROS with pertinent positives and negatives documented in the history of present illness/assessment and plan.  Exam:  Margaret Wang assistant General appearance:  Normal Spine straight without CVA tenderness Abdomen soft nontender without masses guarding rebound organomegaly. Active bowel sounds throughout. Pelvic external BUS vagina normal. Cervix normal. IUD strings visualized.  Uterus normal size midline mobile nontender. Adnexa without masses or tenderness. Rectal exam is normal.  Assessment/Plan:  28 y.o. G0P0 with perimenstrual diarrhea and bloating. Exam is normal. Suspect hormonal related. Not having significant discomfort to suggest endometriosis. Patient asked for STD screening because she was reading on the Internet and GC and chlamydia screen were done at her request. We'll also check urinalysis. Recommend trial of ibuprofen 800 mg 3 times a day to start before anticipated day of symptoms and continue for several days to see if this will not relieve her diarrhea and bloating. Alternatives to include another trial of low-dose oral contraceptives to suppress hormonal fluctuation reviewed. I also discussed possible ultrasound  but she is easy to examine with no palpable abnormalities and no real pain/dysmenorrhea doubt significant issues as far as cyst/endometrioma.  Patient will follow up if her symptoms persist despite trial of ibuprofen. #120 with one refill provided as [place] to [place] his supply to take with her.     Dara LordsFONTAINE,Margaret Wang P MD, 12:08 PM 11/02/2014

## 2014-11-03 LAB — URINALYSIS W MICROSCOPIC + REFLEX CULTURE
BACTERIA UA: NONE SEEN
Bilirubin Urine: NEGATIVE
CRYSTALS: NONE SEEN
Casts: NONE SEEN
GLUCOSE, UA: NEGATIVE mg/dL
Hgb urine dipstick: NEGATIVE
KETONES UR: NEGATIVE mg/dL
NITRITE: NEGATIVE
PH: 7.5 (ref 5.0–8.0)
Protein, ur: NEGATIVE mg/dL
Specific Gravity, Urine: 1.005 (ref 1.005–1.030)
Squamous Epithelial / LPF: NONE SEEN
Urobilinogen, UA: 0.2 mg/dL (ref 0.0–1.0)

## 2014-11-03 LAB — GC/CHLAMYDIA PROBE AMP
CT PROBE, AMP APTIMA: NEGATIVE
GC Probe RNA: NEGATIVE

## 2014-11-04 LAB — URINE CULTURE: Colony Count: 50000

## 2015-06-06 ENCOUNTER — Telehealth: Payer: Self-pay | Admitting: Family Medicine

## 2015-06-06 NOTE — Telephone Encounter (Signed)
I received a email from this pt. She is living abroad in [place], [place]. She requested that we send her medical records via email. She has a history of reoccurring UTI's and would like her records sent to her so she can see a urologist in [place] and have her information with her. I scanned records and forwarded to Melissa Hanks to send via secure to pt. Copy of email will be scanned into pt's chart.
